# Patient Record
Sex: Female | Born: 2000 | Race: White | Hispanic: No | Marital: Single | Smoking: Never smoker
Health system: Southern US, Community
[De-identification: ages and names within clinical notes are randomized; demographics above are authoritative.]

## PROBLEM LIST (undated history)

## (undated) DIAGNOSIS — S43006A Unspecified dislocation of unspecified shoulder joint, initial encounter: Secondary | ICD-10-CM

## (undated) HISTORY — PX: LAPAROSCOPIC GASTRIC SLEEVE RESECTION: SHX5895

## (undated) HISTORY — PX: BREAST SURGERY: SHX581

---

## 2019-09-26 ENCOUNTER — Emergency Department (HOSPITAL_BASED_OUTPATIENT_CLINIC_OR_DEPARTMENT_OTHER)
Admission: EM | Admit: 2019-09-26 | Discharge: 2019-09-26 | Disposition: A | Discharge status: Home / Self Care | Payer: BLUE CROSS/BLUE SHIELD | Attending: Emergency Medicine | Admitting: Emergency Medicine

## 2019-09-26 ENCOUNTER — Encounter (HOSPITAL_BASED_OUTPATIENT_CLINIC_OR_DEPARTMENT_OTHER): Payer: Self-pay

## 2019-09-26 ENCOUNTER — Other Ambulatory Visit: Payer: Self-pay

## 2019-09-26 DIAGNOSIS — R21 Rash and other nonspecific skin eruption: Secondary | ICD-10-CM | POA: Diagnosis not present

## 2019-09-26 DIAGNOSIS — L282 Other prurigo: Secondary | ICD-10-CM

## 2019-09-26 DIAGNOSIS — R233 Spontaneous ecchymoses: Secondary | ICD-10-CM | POA: Diagnosis not present

## 2019-09-26 DIAGNOSIS — L298 Other pruritus: Secondary | ICD-10-CM | POA: Diagnosis not present

## 2019-09-26 DIAGNOSIS — E876 Hypokalemia: Secondary | ICD-10-CM | POA: Diagnosis not present

## 2019-09-26 DIAGNOSIS — R238 Other skin changes: Secondary | ICD-10-CM

## 2019-09-26 LAB — CBC WITH DIFFERENTIAL/PLATELET
Abs Immature Granulocytes: 0.03 10*3/uL (ref 0.00–0.07)
Basophils Absolute: 0.1 10*3/uL (ref 0.0–0.1)
Basophils Relative: 1 %
Eosinophils Absolute: 0.1 10*3/uL (ref 0.0–0.5)
Eosinophils Relative: 1 %
HCT: 41.1 % (ref 36.0–46.0)
Hemoglobin: 13 g/dL (ref 12.0–15.0)
Immature Granulocytes: 0 %
Lymphocytes Relative: 37 %
Lymphs Abs: 4.5 10*3/uL — ABNORMAL HIGH (ref 0.7–4.0)
MCH: 28.1 pg (ref 26.0–34.0)
MCHC: 31.6 g/dL (ref 30.0–36.0)
MCV: 89 fL (ref 80.0–100.0)
Monocytes Absolute: 1 10*3/uL (ref 0.1–1.0)
Monocytes Relative: 8 %
Neutro Abs: 6.4 10*3/uL (ref 1.7–7.7)
Neutrophils Relative %: 53 %
Platelets: 316 10*3/uL (ref 150–400)
RBC: 4.62 MIL/uL (ref 3.87–5.11)
RDW: 13.4 % (ref 11.5–15.5)
WBC: 12.1 10*3/uL — ABNORMAL HIGH (ref 4.0–10.5)
nRBC: 0 % (ref 0.0–0.2)

## 2019-09-26 LAB — COMPREHENSIVE METABOLIC PANEL
ALT: 14 U/L (ref 0–44)
AST: 19 U/L (ref 15–41)
Albumin: 4.1 g/dL (ref 3.5–5.0)
Alkaline Phosphatase: 73 U/L (ref 38–126)
Anion gap: 9 (ref 5–15)
BUN: 16 mg/dL (ref 6–20)
CO2: 26 mmol/L (ref 22–32)
Calcium: 9.3 mg/dL (ref 8.9–10.3)
Chloride: 103 mmol/L (ref 98–111)
Creatinine, Ser: 0.72 mg/dL (ref 0.44–1.00)
GFR calc Af Amer: >60 mL/min (ref >60)
GFR calc non Af Amer: >60 mL/min (ref >60)
Glucose, Bld: 105 mg/dL — ABNORMAL HIGH (ref 70–99)
Potassium: 3 mmol/L — ABNORMAL LOW (ref 3.5–5.1)
Sodium: 138 mmol/L (ref 135–145)
Total Bilirubin: 0.6 mg/dL (ref 0.3–1.2)
Total Protein: 7.2 g/dL (ref 6.5–8.1)

## 2019-09-26 LAB — PROTIME-INR
INR: 1 (ref 0.8–1.2)
Prothrombin Time: 13.3 seconds (ref 11.4–15.2)

## 2019-09-26 MED ORDER — FAMOTIDINE 20 MG PO TABS: 20.0000 mg | ORAL_TABLET | Freq: Two times a day (BID) | ORAL | 0 refills | Status: AC

## 2019-09-26 MED ORDER — POTASSIUM CHLORIDE CRYS ER 20 MEQ PO TBCR: EXTENDED_RELEASE_TABLET | ORAL | 0 refills | Status: AC

## 2019-09-26 NOTE — Discharge Instructions (Addendum)
Keep your appointment with hematology as scheduled. Complete the prednisone. Add pepcid and benadryl. Follow-up with dermatology if no improvement in rash.

## 2019-09-26 NOTE — ED Provider Notes (Signed)
[facility] EMERGENCY DEPARTMENT Provider Note   CSN: [id number] Arrival date & time: 09/26/19  1743     History Chief Complaint  Patient presents with  . Rash  . Bleeding/Bruising    Vanessa Gonzales is a 19 y.o. female.  Patient presents to the ED for evaluation of persistent, scattered, pruritic rash ongoing for 6 weeks. No known exposures.  She has been treated with topical steroids, and was started on a 6 day prednisone taper three days ago.  She has recently changed birth control. Two weeks ago she developed multiple areas of bruising, primarily on her legs.   The history is provided by the patient. No language interpreter was used.  Rash Location:  Full body Quality: bruising and itchiness   Onset quality:  Gradual Duration:  6 weeks Timing:  Constant Progression:  Worsening Chronicity:  New Ineffective treatments:  Topical steroids Associated symptoms: no abdominal pain, no fatigue, no fever, no joint pain and no shortness of breath        History reviewed. No pertinent past medical history.  There are no problems to display for this patient.   Past Surgical History:  Procedure Laterality Date  . BREAST SURGERY    . LAPAROSCOPIC GASTRIC SLEEVE RESECTION       OB History   No obstetric history on file.     No family history on file.  Social History   Tobacco Use  . Smoking status: Never Smoker  . Smokeless tobacco: Never Used  Substance Use Topics  . Alcohol use: Yes    Comment: weekly  . Drug use: Yes    Types: Marijuana    Home Medications Prior to Admission medications   Not on File    Allergies    Patient has no known allergies.  Review of Systems   Review of Systems  Constitutional: Negative for fatigue and fever.  Respiratory: Negative for shortness of breath.   Gastrointestinal: Negative for abdominal pain.  Musculoskeletal: Negative for arthralgias.  Skin: Positive for rash.  Hematological: Bruises/bleeds easily.   All other systems reviewed and are negative.   Physical Exam Updated Vital Signs BP 135/72 (BP Location: Left Arm)   Pulse 89   Temp 98 F (36.7 C) (Oral)   Resp 16   Ht 5\' 6"  (1.676 m)   Wt 73.5 kg   LMP 09/05/2019   SpO2 100%   BMI 26.15 kg/m   Physical Exam Vitals and nursing note reviewed.  Constitutional:      General: She is not in acute distress.    Appearance: She is not ill-appearing.  HENT:     Head: Normocephalic.  Eyes:     Conjunctiva/sclera: Conjunctivae normal.  Cardiovascular:     Rate and Rhythm: Normal rate and regular rhythm.     Heart sounds: Normal heart sounds.  Pulmonary:     Effort: Pulmonary effort is normal.     Breath sounds: Normal breath sounds.  Abdominal:     Palpations: Abdomen is soft.  Musculoskeletal:        General: No swelling. Normal range of motion.  Skin:    General: Skin is warm and dry.     Findings: Bruising and rash present.     Comments: Multiple areas of superficial bruising noted on legs bilaterally.  Neurological:     Mental Status: She is alert and oriented to person, place, and time.  Psychiatric:        Mood and Affect: Mood normal.  ED Results / Procedures / Treatments   Labs (all labs ordered are listed, but only abnormal results are displayed) Labs Reviewed  CBC WITH DIFFERENTIAL/PLATELET - Abnormal; Notable for the following components:      Result Value   WBC 12.1 (*)    Lymphs Abs 4.5 (*)    All other components within normal limits  COMPREHENSIVE METABOLIC PANEL - Abnormal; Notable for the following components:   Potassium 3.0 (*)    Glucose, Bld 105 (*)    All other components within normal limits  PROTIME-INR    EKG None  Radiology No results found.  Procedures Procedures (including critical care time)  Medications Ordered in ED Medications - No data to display  ED Course  I have reviewed the triage vital signs and the nursing notes.  Pertinent labs & imaging results that were  available during my care of the patient were reviewed by me and considered in my medical decision making (see chart for details).    MDM Rules/Calculators/A&P                     Bruising: not anemic. Normal platelet count, PT/INR. Normal LFT's. Patient is schedule to see hematology next week.   Patient with nonspecific eruption. No signs of infection. Patient is currently completing prednisone taper. Discharge with symptomatic treatment. Follow-up with dermatology.    Patient noted have potassium of 3.0.  Prescription for 4 day K-dur taper submitted to [facility] near campus [facility] [place], [place]). Patient contacted, will pick up medication tomorrow.    Final Clinical Impression(s) / ED Diagnoses Final diagnoses:  Pruritic rash  Easy bruising    Rx / DC Orders ED Discharge Orders    None       Etta Quill, NP 09/26/19 2125    Drenda Freeze, MD 09/26/19 2221

## 2019-09-26 NOTE — ED Triage Notes (Signed)
Pt c/o scattered rash x 1.5 months and scattered bruising x 2 weeks-was seen at [facility]-was given a cream-unknown dx and was advised to see a hematologist-NAD-steady gait

## 2020-10-04 ENCOUNTER — Encounter (HOSPITAL_BASED_OUTPATIENT_CLINIC_OR_DEPARTMENT_OTHER): Payer: Self-pay | Admitting: Emergency Medicine

## 2020-10-04 ENCOUNTER — Emergency Department (HOSPITAL_BASED_OUTPATIENT_CLINIC_OR_DEPARTMENT_OTHER): Payer: BLUE CROSS/BLUE SHIELD

## 2020-10-04 ENCOUNTER — Emergency Department (HOSPITAL_BASED_OUTPATIENT_CLINIC_OR_DEPARTMENT_OTHER)
Admission: EM | Admit: 2020-10-04 | Discharge: 2020-10-04 | Disposition: A | Discharge status: Home / Self Care | Payer: BLUE CROSS/BLUE SHIELD | Attending: Emergency Medicine | Admitting: Emergency Medicine

## 2020-10-04 ENCOUNTER — Other Ambulatory Visit: Payer: Self-pay

## 2020-10-04 DIAGNOSIS — M25511 Pain in right shoulder: Secondary | ICD-10-CM

## 2020-10-04 DIAGNOSIS — Y9368 Activity, volleyball (beach) (court): Secondary | ICD-10-CM | POA: Insufficient documentation

## 2020-10-04 DIAGNOSIS — S43006A Unspecified dislocation of unspecified shoulder joint, initial encounter: Secondary | ICD-10-CM

## 2020-10-04 DIAGNOSIS — X501XXA Overexertion from prolonged static or awkward postures, initial encounter: Secondary | ICD-10-CM | POA: Diagnosis not present

## 2020-10-04 HISTORY — DX: Unspecified dislocation of unspecified shoulder joint, initial encounter: S43.006A

## 2020-10-04 MED ORDER — DICLOFENAC SODIUM 1 % EX GEL: 2.0000 g | Freq: Four times a day (QID) | CUTANEOUS | 1 refills | Status: AC

## 2020-10-04 NOTE — ED Notes (Signed)
Pt discharged to home. Discharge instructions have been discussed with patient and/or family members. Pt verbally acknowledges understanding d/c instructions, and endorses comprehension to checkout at registration before leaving.  °

## 2020-10-04 NOTE — ED Provider Notes (Signed)
[facility] EMERGENCY DEPARTMENT Provider Note   CSN: [id number] Arrival date & time: 10/04/20  1234     History Chief Complaint  Patient presents with  . Dislocation    Vanessa Gonzales is a 20 y.o. female.  HPI 20 year old female with no significant medical history presents to the ER with complaints of right shoulder soreness and clicking after dislocating her shoulder while playing volleyball on Friday.  She states that she popped her shoulder back in.  Since then has had soreness and some clicking when she moves it.  She has been able to lift her arm above midline.  No redislocations.  She reports history of shoulder dislocations in middle school and while she played softball.  She denies any numbness or tingling, no discoloration or or cold sensations to the extremity.  She has not taken anything for pain.    Past Medical History:  Diagnosis Date  . Shoulder dislocation     There are no problems to display for this patient.   Past Surgical History:  Procedure Laterality Date  . BREAST SURGERY    . LAPAROSCOPIC GASTRIC SLEEVE RESECTION       OB History   No obstetric history on file.     No family history on file.  Social History   Tobacco Use  . Smoking status: Never Smoker  . Smokeless tobacco: Never Used  Vaping Use  . Vaping Use: Every day  . Substances: Nicotine  Substance Use Topics  . Alcohol use: Yes    Comment: weekly  . Drug use: Yes    Types: Marijuana    Home Medications Prior to Admission medications   Medication Sig Start Date End Date Taking? Authorizing Provider  diclofenac Sodium (VOLTAREN) 1 % GEL Apply 2 g topically 4 (four) times daily. 10/04/20  Yes Mare Ferrari, PA-C  famotidine (PEPCID) 20 MG tablet Take 1 tablet (20 mg total) by mouth 2 (two) times daily. 09/26/19   Felicie Morn, NP  potassium chloride SA (KLOR-CON) 20 MEQ tablet Take 2 tablets (40 mEq total) by mouth daily for 1 day, THEN 1 tablet (20 mEq total) daily  for 3 days. 09/26/19 09/30/19  Felicie Morn, NP    Allergies    Patient has no known allergies.  Review of Systems   Review of Systems  Musculoskeletal: Positive for arthralgias.  Neurological: Negative for weakness and numbness.    Physical Exam Updated Vital Signs BP 118/70 (BP Location: Left Arm)   Pulse 76   Temp 98.2 F (36.8 C) (Oral)   Resp 16   Ht 5\' 6"  (1.676 m)   Wt 72.6 kg   LMP 09/15/2020   SpO2 100%   BMI 25.82 kg/m   Physical Exam Vitals reviewed.  Constitutional:      Appearance: Normal appearance. She is not ill-appearing or diaphoretic.  HENT:     Head: Normocephalic and atraumatic.  Eyes:     General:        Right eye: No discharge.        Left eye: No discharge.     Extraocular Movements: Extraocular movements intact.     Conjunctiva/sclera: Conjunctivae normal.  Musculoskeletal:        General: No swelling. Normal range of motion.     Comments: No visible step-offs or crepitus to the right shoulder.  Able to raise above midline.  2+ radial pulses, 5/5 strength, neurovascularly intact.  Neurological:     General: No focal deficit present.  Mental Status: She is alert and oriented to person, place, and time.  Psychiatric:        Mood and Affect: Mood normal.        Behavior: Behavior normal.     ED Results / Procedures / Treatments   Labs (all labs ordered are listed, but only abnormal results are displayed) Labs Reviewed - No data to display  EKG None  Radiology DG Shoulder Right  Result Date: 10/04/2020 CLINICAL DATA:  The patient reports a right shoulder injury/dislocation playing with a ball 09/25/2020. Initial encounter. EXAM: RIGHT SHOULDER - 2+ VIEW COMPARISON:  None. FINDINGS: There is no evidence of fracture or dislocation. There is no evidence of arthropathy or other focal bone abnormality. Soft tissues are unremarkable. IMPRESSION: Normal examination. Electronically Signed   By: Drusilla Kanner M.D.   On: 10/04/2020 13:36     Procedures Procedures   Medications Ordered in ED Medications - No data to display  ED Course  I have reviewed the triage vital signs and the nursing notes.  Pertinent labs & imaging results that were available during my care of the patient were reviewed by me and considered in my medical decision making (see chart for details).    MDM Rules/Calculators/A&P                          20 year old female presents to the ER with right shoulder soreness and looking after dislocating her shoulder on Friday.  She has no evidence of neurovascular compromise, no evidence of dislocations on plain films here today.  Will provide shoulder sling, Ortho follow-up, encouraged Tylenol or ibuprofen for pain.  She will likely benefit from physical therapy.  We discussed return precautions.  She voiced understanding is agreeable.  At this stage in the ED course, the patient is medically screened and stable for discharge. Final Clinical Impression(s) / ED Diagnoses Final diagnoses:  Acute pain of right shoulder    Rx / DC Orders ED Discharge Orders         Ordered    diclofenac Sodium (VOLTAREN) 1 % GEL  4 times daily        10/04/20 1339           Leone Brand 10/04/20 1340    Terrilee Files, MD 10/04/20 1740

## 2020-10-04 NOTE — ED Triage Notes (Signed)
Pt reports dislocated right shoulder on Friday while playing with a beach ball. Pt reports pain and clicking sounds with movement in right shoulder.

## 2020-10-04 NOTE — Discharge Instructions (Addendum)
You were evaluated in the Emergency Department and after careful evaluation, we did not find any emergent condition requiring admission or further testing in the hospital.  You may take Tylenol or ibuprofen for pain.  Wear the sling as much as possible until you are evaluated by orthopedic doctor.  You may apply ice as well as needed.  I have also provided an anti-inflammatory cream which you may apply to your shoulder as directed.  Please follow-up with the orthopedic doctor whose contact information provided in your discharge paperwork.  You would likely benefit from some further physical therapy.  Please return to the Emergency Department if you experience any worsening of your condition.  Thank you for allowing [place] to be a part of your care.

## 2022-04-20 IMAGING — DX DG SHOULDER 2+V*R*
3 series · 3 of 3 positions shown · non-contrast
Comparison: None.

CLINICAL DATA: The patient reports a right shoulder
injury/dislocation playing with a ball 09/25/2020. Initial
encounter.

EXAM:
RIGHT SHOULDER - 2+ VIEW

[shoulder grashey]
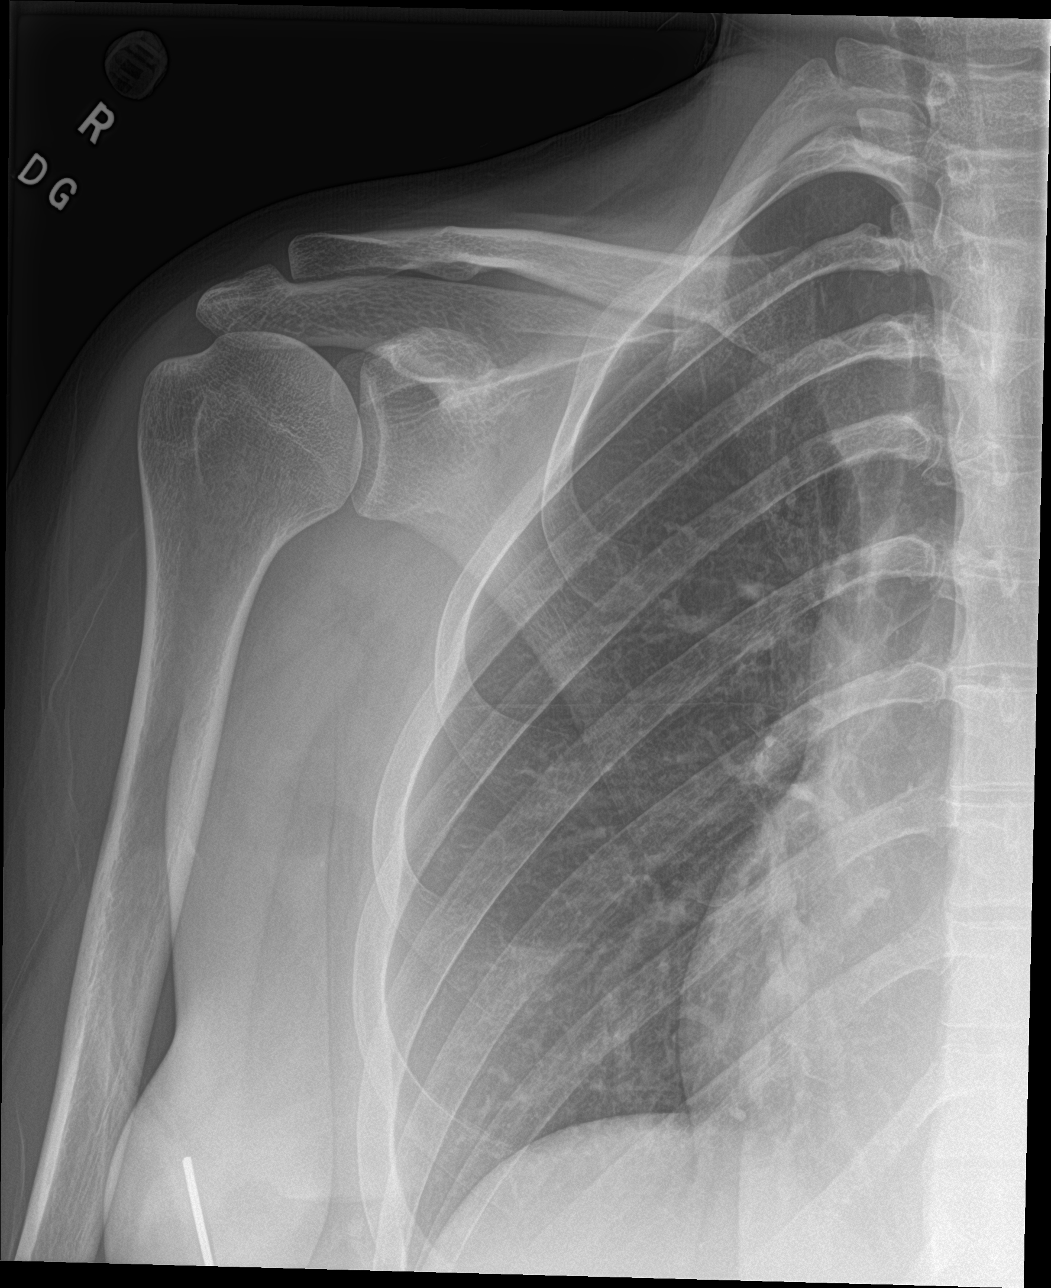

[shoulder y view]
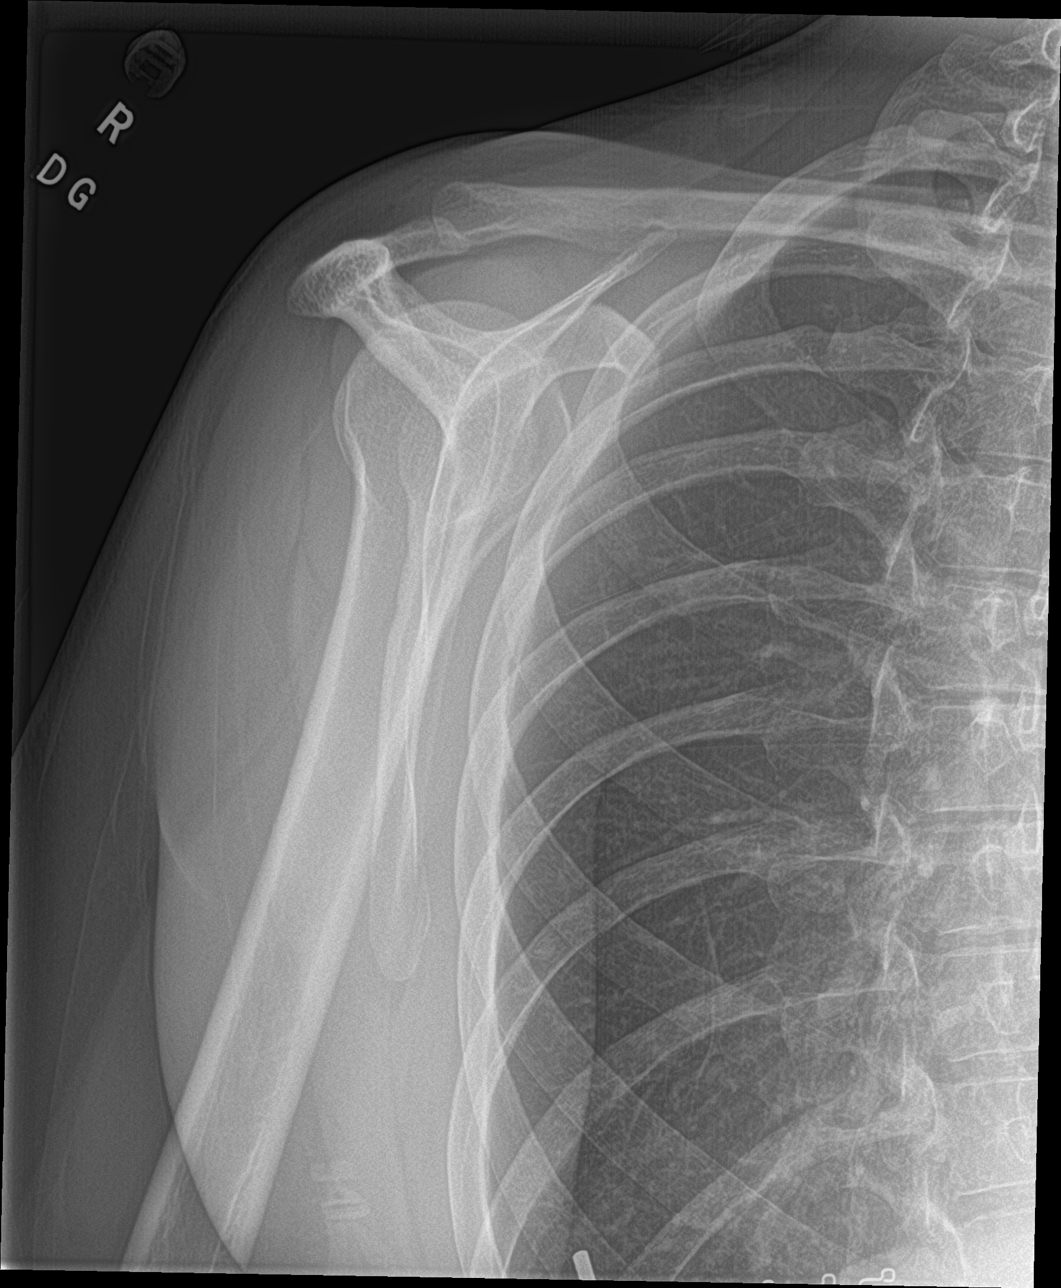

[shoulder axillary]
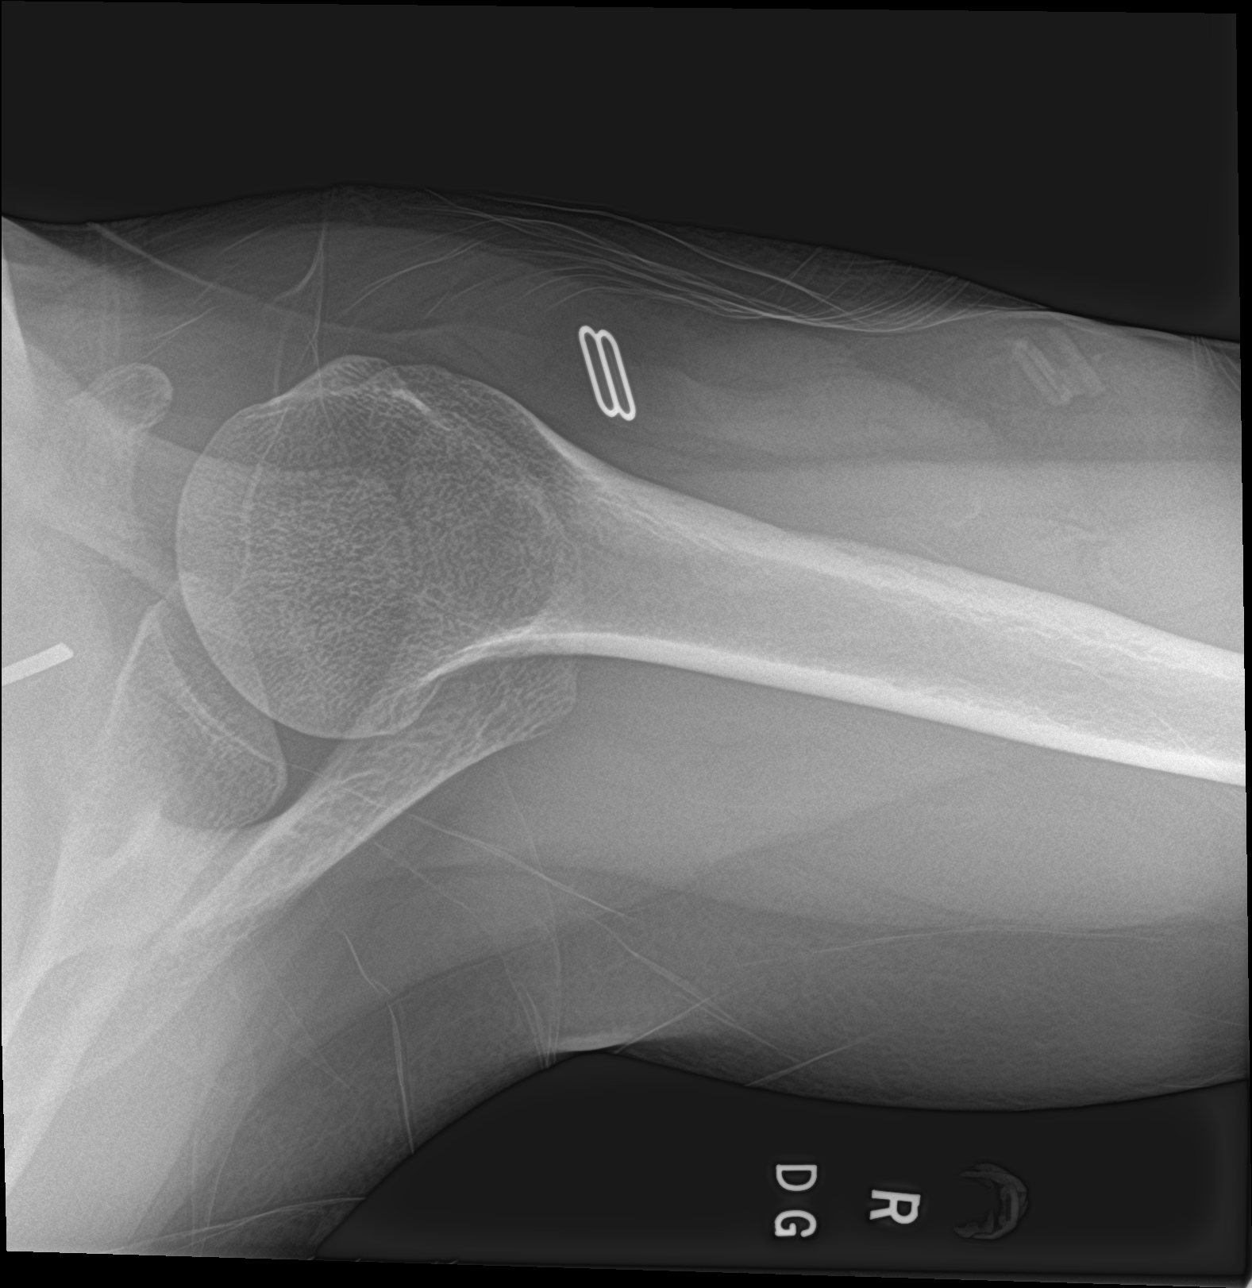

[3 of 3 positions shown; findings below may reference images not displayed]

FINDINGS: There is no evidence of fracture or dislocation. There is no
evidence of arthropathy or other focal bone abnormality. Soft
tissues are unremarkable.
IMPRESSION: Normal examination.
